# Patient Record
Sex: Female | Born: 1990
Health system: Southern US, Community
[De-identification: ages and names within clinical notes are randomized; demographics above are authoritative.]

## PROBLEM LIST (undated history)

## (undated) DIAGNOSIS — M419 Scoliosis, unspecified: Secondary | ICD-10-CM

## (undated) DIAGNOSIS — R87619 Unspecified abnormal cytological findings in specimens from cervix uteri: Secondary | ICD-10-CM

## (undated) DIAGNOSIS — A64 Unspecified sexually transmitted disease: Secondary | ICD-10-CM

## (undated) HISTORY — PX: COLPOSCOPY: SHX161

## (undated) HISTORY — PX: LEEP: SHX91

## (undated) HISTORY — DX: Unspecified sexually transmitted disease: A64

## (undated) HISTORY — DX: Unspecified abnormal cytological findings in specimens from cervix uteri: R87.619

---

## 2012-07-16 DIAGNOSIS — N9089 Other specified noninflammatory disorders of vulva and perineum: Secondary | ICD-10-CM | POA: Insufficient documentation

## 2012-08-11 DIAGNOSIS — N901 Moderate vulvar dysplasia: Secondary | ICD-10-CM | POA: Insufficient documentation

## 2013-08-18 ENCOUNTER — Emergency Department (HOSPITAL_COMMUNITY)
Admission: EM | Admit: 2013-08-18 | Discharge: 2013-08-18 | Disposition: A | Discharge status: Home / Self Care | Payer: BC Managed Care – PPO | Attending: Emergency Medicine | Admitting: Emergency Medicine

## 2013-08-18 ENCOUNTER — Encounter (HOSPITAL_COMMUNITY): Payer: Self-pay | Admitting: *Deleted

## 2013-08-18 DIAGNOSIS — R509 Fever, unspecified: Secondary | ICD-10-CM | POA: Insufficient documentation

## 2013-08-18 DIAGNOSIS — J069 Acute upper respiratory infection, unspecified: Secondary | ICD-10-CM | POA: Insufficient documentation

## 2013-08-18 DIAGNOSIS — R059 Cough, unspecified: Secondary | ICD-10-CM | POA: Insufficient documentation

## 2013-08-18 DIAGNOSIS — R52 Pain, unspecified: Secondary | ICD-10-CM | POA: Insufficient documentation

## 2013-08-18 DIAGNOSIS — R05 Cough: Secondary | ICD-10-CM | POA: Insufficient documentation

## 2013-08-18 DIAGNOSIS — J3489 Other specified disorders of nose and nasal sinuses: Secondary | ICD-10-CM | POA: Insufficient documentation

## 2013-08-18 DIAGNOSIS — J029 Acute pharyngitis, unspecified: Secondary | ICD-10-CM

## 2013-08-18 LAB — RAPID STREP SCREEN (MED CTR MEBANE ONLY): Streptococcus, Group A Screen (Direct): NEGATIVE

## 2013-08-18 MED ORDER — DEXAMETHASONE 6 MG PO TABS
12.0000 mg | ORAL_TABLET | Freq: Once | ORAL | Status: AC
  Administered 2013-08-18: 12 mg via ORAL
  Filled 2013-08-18: qty 2

## 2013-08-18 NOTE — ED Notes (Signed)
Pt c/o sore throat, fever, chills, generalized body aches, HA, productive cough.

## 2013-08-18 NOTE — ED Provider Notes (Signed)
CSN: [id number]     Arrival date & time 08/18/13  0350 History   First MD Initiated Contact with Patient 08/18/13 0357     Chief Complaint  Patient presents with  . URI   (Consider location/radiation/quality/duration/timing/severity/associated sxs/prior Treatment) Patient is a 22 y.o. female presenting with URI. The history is provided by the patient.  URI She comes in with a two-day history of sore throat, fever to 102, chills, nasal congestion, cough productive of yellow sputum. There is associated generalized bodyaches. She is complaining of pain that she rates at 9/10. She has used a variety of over-the-counter cough and cold medications with no relief. She states that her sister also is ill but got L. at the same time. There were no known sick contacts prior to her getting ill.  No past medical history on file. No past surgical history on file. No family history on file. History  Substance Use Topics  . Smoking status: Not on file  . Smokeless tobacco: Not on file  . Alcohol Use: Not on file   OB History   No data available     Review of Systems  All other systems reviewed and are negative.    Allergies  Review of patient's allergies indicates not on file.  Home Medications  No current outpatient prescriptions on file. BP 130/84  Pulse 89  Temp(Src) 98 F (36.7 C) (Oral)  Resp 18  SpO2 100% Physical Exam  Nursing note and vitals reviewed.  22 year old female, resting comfortably and in no acute distress. Vital signs are normal. Oxygen saturation is 100%, which is normal. Head is normocephalic and atraumatic. PERRLA, EOMI. Oropharynx is moderately erythematous without exudate. She's not having any difficulty handling secretions and phonation is normal. Turbinates are edematous bilaterally. No obvious nasal drainage is seen. Neck is nontender and supple without adenopathy or JVD. Back is nontender and there is no CVA tenderness. Lungs are clear without rales,  wheezes, or rhonchi. Chest is nontender. Heart has regular rate and rhythm without murmur. Abdomen is soft, flat, nontender without masses or hepatosplenomegaly and peristalsis is normoactive. Extremities have no cyanosis or edema, full range of motion is present. Skin is warm and dry without rash. Neurologic: Mental status is normal, cranial nerves are intact, there are no motor or sensory deficits.  ED Course  Procedures (including critical care time) Results for orders placed during the hospital encounter of 08/18/13  RAPID STREP SCREEN      Result Value Range   Streptococcus, Group A Screen (Direct) NEGATIVE  NEGATIVE    MDM   1. Upper respiratory infection   2. Pharyngitis     upper respiratory infection with pharyngitis. Strep screen will be obtained and she will be treated symptomatically if negative. She is given a dose of dexamethasone. Old records are reviewed and there are no relevant past visits.    Dione Booze, MD 08/18/13 [phone]

## 2013-08-20 LAB — CULTURE, GROUP A STREP

## 2013-12-19 ENCOUNTER — Emergency Department (HOSPITAL_COMMUNITY)
Admission: EM | Admit: 2013-12-19 | Discharge: 2013-12-19 | Disposition: A | Discharge status: Other Acute Inpatient Hospital | Payer: BC Managed Care – PPO

## 2013-12-19 ENCOUNTER — Encounter (HOSPITAL_COMMUNITY): Payer: Self-pay | Admitting: Emergency Medicine

## 2013-12-19 DIAGNOSIS — R197 Diarrhea, unspecified: Secondary | ICD-10-CM | POA: Insufficient documentation

## 2013-12-19 DIAGNOSIS — Z882 Allergy status to sulfonamides status: Secondary | ICD-10-CM | POA: Insufficient documentation

## 2013-12-19 DIAGNOSIS — Z888 Allergy status to other drugs, medicaments and biological substances status: Secondary | ICD-10-CM | POA: Insufficient documentation

## 2013-12-19 DIAGNOSIS — R6883 Chills (without fever): Secondary | ICD-10-CM | POA: Insufficient documentation

## 2013-12-19 DIAGNOSIS — Z3202 Encounter for pregnancy test, result negative: Secondary | ICD-10-CM | POA: Insufficient documentation

## 2013-12-19 DIAGNOSIS — R112 Nausea with vomiting, unspecified: Secondary | ICD-10-CM | POA: Insufficient documentation

## 2013-12-19 DIAGNOSIS — R109 Unspecified abdominal pain: Secondary | ICD-10-CM | POA: Insufficient documentation

## 2013-12-19 DIAGNOSIS — IMO0001 Reserved for inherently not codable concepts without codable children: Secondary | ICD-10-CM | POA: Insufficient documentation

## 2013-12-19 DIAGNOSIS — Z881 Allergy status to other antibiotic agents status: Secondary | ICD-10-CM | POA: Insufficient documentation

## 2013-12-19 LAB — COMPREHENSIVE METABOLIC PANEL
AST: 16 U/L (ref 0–37)
BUN: 9 mg/dL (ref 6–23)
CO2: 27 mEq/L (ref 19–32)
Calcium: 8.5 mg/dL (ref 8.4–10.5)
Chloride: 104 mEq/L (ref 96–112)
Creatinine, Ser: 0.8 mg/dL (ref 0.50–1.10)
GFR calc Af Amer: >90 mL/min (ref >90)
GFR calc non Af Amer: >90 mL/min (ref >90)
Total Bilirubin: 0.3 mg/dL (ref 0.3–1.2)

## 2013-12-19 LAB — URINALYSIS, ROUTINE W REFLEX MICROSCOPIC
Glucose, UA: NEGATIVE mg/dL
Ketones, ur: NEGATIVE mg/dL
Nitrite: NEGATIVE
Protein, ur: NEGATIVE mg/dL
Urobilinogen, UA: 1 mg/dL (ref 0.0–1.0)

## 2013-12-19 LAB — CBC WITH DIFFERENTIAL/PLATELET
Basophils Absolute: 0 10*3/uL (ref 0.0–0.1)
Eosinophils Relative: 2 % (ref 0–5)
HCT: 38.9 % (ref 36.0–46.0)
Hemoglobin: 13 g/dL (ref 12.0–15.0)
Lymphocytes Relative: 43 % (ref 12–46)
MCHC: 33.4 g/dL (ref 30.0–36.0)
MCV: 88.4 fL (ref 78.0–100.0)
Monocytes Absolute: 0.8 10*3/uL (ref 0.1–1.0)
Monocytes Relative: 16 % — ABNORMAL HIGH (ref 3–12)
RDW: 12.9 % (ref 11.5–15.5)
WBC: 5.3 10*3/uL (ref 4.0–10.5)

## 2013-12-19 LAB — URINE MICROSCOPIC-ADD ON

## 2013-12-19 LAB — POCT PREGNANCY, URINE: Preg Test, Ur: NEGATIVE

## 2013-12-19 LAB — LIPASE, BLOOD: Lipase: 19 U/L (ref 11–59)

## 2013-12-19 NOTE — ED Notes (Signed)
Pt. reports emesis , diarrhea , body aches and chills onset Saturday this week .

## 2013-12-20 ENCOUNTER — Emergency Department (HOSPITAL_COMMUNITY)
Admission: EM | Admit: 2013-12-20 | Discharge: 2013-12-20 | Disposition: A | Discharge status: Home / Self Care | Payer: BC Managed Care – PPO | Attending: Emergency Medicine | Admitting: Emergency Medicine

## 2013-12-20 DIAGNOSIS — R112 Nausea with vomiting, unspecified: Secondary | ICD-10-CM

## 2013-12-20 MED ORDER — LOPERAMIDE HCL 2 MG PO CAPS
4.0000 mg | ORAL_CAPSULE | Freq: Once | ORAL | Status: AC
  Administered 2013-12-20: 4 mg via ORAL
  Filled 2013-12-20: qty 2

## 2013-12-20 MED ORDER — SODIUM CHLORIDE 0.9 % IV SOLN
1000.0000 mL | INTRAVENOUS | Status: DC
  Administered 2013-12-20: 1000 mL via INTRAVENOUS

## 2013-12-20 MED ORDER — SODIUM CHLORIDE 0.9 % IV SOLN
1000.0000 mL | Freq: Once | INTRAVENOUS | Status: AC
  Administered 2013-12-20: 1000 mL via INTRAVENOUS

## 2013-12-20 MED ORDER — ONDANSETRON HCL 4 MG PO TABS: 4.0000 mg | ORAL_TABLET | Freq: Four times a day (QID) | ORAL | Status: DC | PRN

## 2013-12-20 MED ORDER — ONDANSETRON HCL 4 MG/2ML IJ SOLN
4.0000 mg | Freq: Once | INTRAMUSCULAR | Status: AC
  Administered 2013-12-20: 4 mg via INTRAVENOUS
  Filled 2013-12-20: qty 2

## 2013-12-20 NOTE — ED Notes (Signed)
No further nausea or vomiting or diarrhea.  Patient ambulated to the BR without difficulty

## 2013-12-20 NOTE — ED Notes (Signed)
Pt states Saturday she ate pizza, and started throwing up and having diarrhea. Pt states ever since then shes been having lots of n/v/d. States mid lower belly pain. Pt states last time she threw up was this morning. Pt states she had diarrhea five times today. Pt states shes been unable to eat much of anything. Denies problems with urinating.

## 2013-12-20 NOTE — ED Notes (Signed)
No further vomiting or diahrrea

## 2013-12-20 NOTE — ED Notes (Signed)
Patient states she vomited 2 times today and had diarrhea about 5 times

## 2013-12-20 NOTE — ED Provider Notes (Signed)
CSN: [id number]     Arrival date & time 12/19/13  2031 History   First MD Initiated Contact with Patient 12/20/13 0315     Chief Complaint  Patient presents with  . Emesis  . Diarrhea   (Consider location/radiation/quality/duration/timing/severity/associated sxs/prior Treatment) Patient is a 22 y.o. female presenting with vomiting and diarrhea. The history is provided by the patient.  Emesis Associated symptoms: diarrhea   Diarrhea Associated symptoms: vomiting   She has been having vomiting and diarrhea for the last 4 days. She is having 5 or more bowel movements a day and vomiting multiple times. She's not able to hold anything down. She denies fever but has had some chills. She has had some generalized bodyaches as well as some mild abdominal cramping. The abdominal cramping is improved following the bowel movement. Pain is mild she rates it 3/10. She denies any sick contacts. She relates her illness to eating some pizza but is unsure if anybody else who ate the pizza he became ill. She denies any blood or mucus in stool or emesis. She has not taken any medication to try and help her symptoms.  History reviewed. No pertinent past medical history. History reviewed. No pertinent past surgical history. Family History  Problem Relation Age of Onset  . Diabetes Mother   . Hypertension Mother   . Stroke Mother   . Hypertension Father   . Asthma Sister    History  Substance Use Topics  . Smoking status: Never Smoker   . Smokeless tobacco: Never Used  . Alcohol Use: Yes   OB History   Grav Para Term Preterm Abortions TAB SAB Ect Mult Living                 Review of Systems  Gastrointestinal: Positive for vomiting and diarrhea.  All other systems reviewed and are negative.    Allergies  Amoxicillin; Neomycin; and Sulfa antibiotics  Home Medications   Current Outpatient Rx  Name  Route  Sig  Dispense  Refill  . loperamide (IMODIUM) 1 MG/5ML solution   Oral   Take 1 mg  by mouth as needed for diarrhea or loose stools.         . Pseudoeph-Doxylamine-DM-APAP (DAYQUIL/NYQUIL COLD/FLU RELIEF PO)   Oral   Take 1 tablet by mouth 2 (two) times daily as needed (for cold symptoms).          BP 128/74  Pulse 88  Temp(Src) 99 F (37.2 C) (Oral)  Resp 16  Wt 161 lb (73.029 kg)  SpO2 99%  LMP 11/25/2013 Physical Exam  Nursing note and vitals reviewed.  22 year old female, resting comfortably and in no acute distress. Vital signs are significant for borderline hypertension with blood pressure 129/92. Oxygen saturation is 100%, which is normal. Head is normocephalic and atraumatic. PERRLA, EOMI. Oropharynx is clear. Neck is nontender and supple without adenopathy or JVD. Back is nontender and there is no CVA tenderness. Lungs are clear without rales, wheezes, or rhonchi. Chest is nontender. Heart has regular rate and rhythm without murmur. Abdomen is soft, flat, nontender without masses or hepatosplenomegaly and peristalsis is normoactive. Extremities have no cyanosis or edema, full range of motion is present. Skin is warm and dry without rash. Neurologic: Mental status is normal, cranial nerves are intact, there are no motor or sensory deficits.  ED Course  Procedures (including critical care time) Labs Review Results for orders placed during the hospital encounter of 12/20/13  CBC WITH DIFFERENTIAL  Result Value Range   WBC 5.3  4.0 - 10.5 K/uL   RBC 4.40  3.87 - 5.11 MIL/uL   Hemoglobin 13.0  12.0 - 15.0 g/dL   HCT 16.1  09.6 - 04.5 %   MCV 88.4  78.0 - 100.0 fL   MCH 29.5  26.0 - 34.0 pg   MCHC 33.4  30.0 - 36.0 g/dL   RDW 40.9  81.1 - 91.4 %   Platelets 229  150 - 400 K/uL   Neutrophils Relative % 38 (*) 43 - 77 %   Neutro Abs 2.0  1.7 - 7.7 K/uL   Lymphocytes Relative 43  12 - 46 %   Lymphs Abs 2.3  0.7 - 4.0 K/uL   Monocytes Relative 16 (*) 3 - 12 %   Monocytes Absolute 0.8  0.1 - 1.0 K/uL   Eosinophils Relative 2  0 - 5 %    Eosinophils Absolute 0.1  0.0 - 0.7 K/uL   Basophils Relative 1  0 - 1 %   Basophils Absolute 0.0  0.0 - 0.1 K/uL  COMPREHENSIVE METABOLIC PANEL      Result Value Range   Sodium 140  135 - 145 mEq/L   Potassium 3.4 (*) 3.5 - 5.1 mEq/L   Chloride 104  96 - 112 mEq/L   CO2 27  19 - 32 mEq/L   Glucose, Bld 80  70 - 99 mg/dL   BUN 9  6 - 23 mg/dL   Creatinine, Ser 7.82  0.50 - 1.10 mg/dL   Calcium 8.5  8.4 - 95.6 mg/dL   Total Protein 8.1  6.0 - 8.3 g/dL   Albumin 3.7  3.5 - 5.2 g/dL   AST 16  0 - 37 U/L   ALT 14  0 - 35 U/L   Alkaline Phosphatase 46  39 - 117 U/L   Total Bilirubin 0.3  0.3 - 1.2 mg/dL   GFR calc non Af Amer >90  >90 mL/min   GFR calc Af Amer >90  >90 mL/min  LIPASE, BLOOD      Result Value Range   Lipase 19  11 - 59 U/L  URINALYSIS, ROUTINE W REFLEX MICROSCOPIC      Result Value Range   Color, Urine YELLOW  YELLOW   APPearance CLOUDY (*) CLEAR   Specific Gravity, Urine 1.014  1.005 - 1.030   pH 6.0  5.0 - 8.0   Glucose, UA NEGATIVE  NEGATIVE mg/dL   Hgb urine dipstick MODERATE (*) NEGATIVE   Bilirubin Urine NEGATIVE  NEGATIVE   Ketones, ur NEGATIVE  NEGATIVE mg/dL   Protein, ur NEGATIVE  NEGATIVE mg/dL   Urobilinogen, UA 1.0  0.0 - 1.0 mg/dL   Nitrite NEGATIVE  NEGATIVE   Leukocytes, UA SMALL (*) NEGATIVE  URINE MICROSCOPIC-ADD ON      Result Value Range   Squamous Epithelial / LPF MANY (*) RARE   WBC, UA 3-6  <3 WBC/hpf   RBC / HPF 3-6  <3 RBC/hpf   Bacteria, UA MANY (*) RARE  POCT PREGNANCY, URINE      Result Value Range   Preg Test, Ur NEGATIVE  NEGATIVE   No results found.   MDM   1. Nausea vomiting and diarrhea    Vomiting and diarrhea which may be a form of food poisoning versus viral gastroenteritis. It spread of multiple bowel movements and vomiting and not begin to tolerate oral fluids, patient does not appear dehydrated. Mucous membranes are moist and laboratory  workup shows no evidence of dehydration. However, she will be given some IV  fluids and will be given IV ondansetron for nausea and oral loperamide for diarrhea.  She feels somewhat better after Proventil treatment. She is discharged with prescriptions for intervention done and is felt to continue  Dione Booze, MD 12/20/13 [phone]

## 2013-12-21 LAB — URINE CULTURE: Colony Count: >=100000

## 2015-10-11 ENCOUNTER — Emergency Department (HOSPITAL_COMMUNITY): Payer: BLUE CROSS/BLUE SHIELD

## 2015-10-11 ENCOUNTER — Encounter (HOSPITAL_COMMUNITY): Payer: Self-pay | Admitting: Emergency Medicine

## 2015-10-11 ENCOUNTER — Emergency Department (HOSPITAL_COMMUNITY)
Admission: EM | Admit: 2015-10-11 | Discharge: 2015-10-12 | Disposition: A | Discharge status: Home / Self Care | Payer: BLUE CROSS/BLUE SHIELD | Attending: Emergency Medicine | Admitting: Emergency Medicine

## 2015-10-11 DIAGNOSIS — Y998 Other external cause status: Secondary | ICD-10-CM | POA: Insufficient documentation

## 2015-10-11 DIAGNOSIS — Z8739 Personal history of other diseases of the musculoskeletal system and connective tissue: Secondary | ICD-10-CM | POA: Diagnosis not present

## 2015-10-11 DIAGNOSIS — W2203XA Walked into furniture, initial encounter: Secondary | ICD-10-CM | POA: Insufficient documentation

## 2015-10-11 DIAGNOSIS — Y9389 Activity, other specified: Secondary | ICD-10-CM | POA: Insufficient documentation

## 2015-10-11 DIAGNOSIS — S99922A Unspecified injury of left foot, initial encounter: Secondary | ICD-10-CM | POA: Diagnosis present

## 2015-10-11 DIAGNOSIS — S92912A Unspecified fracture of left toe(s), initial encounter for closed fracture: Secondary | ICD-10-CM

## 2015-10-11 DIAGNOSIS — Y9289 Other specified places as the place of occurrence of the external cause: Secondary | ICD-10-CM | POA: Insufficient documentation

## 2015-10-11 DIAGNOSIS — S92512A Displaced fracture of proximal phalanx of left lesser toe(s), initial encounter for closed fracture: Secondary | ICD-10-CM | POA: Diagnosis not present

## 2015-10-11 HISTORY — DX: Scoliosis, unspecified: M41.9

## 2015-10-12 MED ORDER — NAPROXEN 500 MG PO TABS
500.0000 mg | ORAL_TABLET | Freq: Once | ORAL | Status: AC
  Administered 2015-10-12: 500 mg via ORAL
  Filled 2015-10-12: qty 1

## 2015-10-12 MED ORDER — NAPROXEN 500 MG PO TABS: 500.0000 mg | ORAL_TABLET | Freq: Two times a day (BID) | ORAL | Status: DC

## 2015-10-12 NOTE — ED Provider Notes (Signed)
CSN: [id number]     Arrival date & time 10/11/15  2224 History   First MD Initiated Contact with Patient 10/11/15 2351     Chief Complaint  Patient presents with  . Foot Pain     (Consider location/radiation/quality/duration/timing/severity/associated sxs/prior Treatment) Patient is a 24 y.o. female presenting with foot injury. The history is provided by the patient. No language interpreter was used.  Foot Injury Location:  Toe Time since incident:  3 hours Injury: yes   Toe location:  L little toe Pain details:    Quality:  Aching and throbbing   Radiates to:  Does not radiate   Severity:  Moderate   Onset quality:  Sudden   Timing:  Constant   Progression:  Worsening Chronicity:  New Dislocation: no   Prior injury to area:  No Relieved by:  Nothing Worsened by:  Bearing weight Ineffective treatments:  Ice Associated symptoms: swelling   Associated symptoms: no decreased ROM and no numbness   Risk factors: no frequent fractures     Past Medical History  Diagnosis Date  . Scoliosis    History reviewed. No pertinent past surgical history. Family History  Problem Relation Age of Onset  . Diabetes Mother   . Hypertension Mother   . Stroke Mother   . Hypertension Father   . Asthma Sister    Social History  Substance Use Topics  . Smoking status: Never Smoker   . Smokeless tobacco: Never Used  . Alcohol Use: Yes   OB History    No data available     Review of Systems  Skin: Positive for color change (bruising).  All other systems reviewed and are negative.   Allergies  Amoxicillin; Neomycin; and Sulfa antibiotics  Home Medications   Prior to Admission medications   Medication Sig Start Date End Date Taking? Authorizing Provider  naproxen (NAPROSYN) 500 MG tablet Take 1 tablet (500 mg total) by mouth 2 (two) times daily. 10/12/15   Antony Madura, PA-C  ondansetron (ZOFRAN) 4 MG tablet Take 1 tablet (4 mg total) by mouth every 6 (six) hours as needed for  nausea or vomiting. Patient not taking: Reported on 10/11/2015 12/20/13   Dione Booze, MD   BP 128/81 mmHg  Pulse 94  Temp(Src) 97.8 F (36.6 C) (Oral)  Resp 18  SpO2 100%  LMP 09/17/2015   Physical Exam  Constitutional: She is oriented to person, place, and time. She appears well-developed and well-nourished. No distress.  HENT:  Head: Normocephalic and atraumatic.  Eyes: Conjunctivae and EOM are normal. No scleral icterus.  Neck: Normal range of motion.  Cardiovascular: Normal rate, regular rhythm and intact distal pulses.   DP and PT pulses 2+ in the left lower extremity. Capillary refill brisk in all digits of left foot.  Pulmonary/Chest: Effort normal. No respiratory distress.  Respirations even and unlabored  Musculoskeletal: Normal range of motion.       Left foot: There is tenderness, bony tenderness and swelling. There is normal range of motion, normal capillary refill and no deformity.       Feet:  Neurological: She is alert and oriented to person, place, and time. She exhibits normal muscle tone. Coordination normal.  Sensation to light touch intact in the RLE and foot. Patient able to wiggle all toes.  Skin: Skin is warm and dry. No rash noted. She is not diaphoretic. No erythema. No pallor.  Psychiatric: She has a normal mood and affect. Her behavior is normal.  Nursing note  and vitals reviewed.   ED Course  Procedures (including critical care time) Labs Review Labs Reviewed - No data to display  Imaging Review Dg Foot Complete Left  10/12/2015  CLINICAL DATA:  Injured pinky toe, hit foot on piece of furniture tonight. Pain and bruising. EXAM: LEFT FOOT - COMPLETE 3+ VIEW COMPARISON:  None. FINDINGS: Mildly displaced transverse fifth toe proximal phalanx fracture. No intra-articular extension. The remainder of the foot is intact. No additional fracture. Os peroneal incidentally noted. IMPRESSION: Mildly displaced fifth toe proximal phalanx fracture. Electronically  Signed   By: Rubye OaksMelanie  Ehinger M.D.   On: 10/12/2015 00:10   I have personally reviewed and evaluated these images and lab results as part of my medical decision-making.   EKG Interpretation None      MDM   Final diagnoses:  Closed fracture of proximal phalanx of toe of left foot    24 year old female percent study emergency department for pain to the base of her left fifth toe. X-ray shows a mildly displaced fifth toe proximal phalanx fracture. Patient neurovascularly intact. She has been given a cam walker for stability, especially since the patient is very mobile; currently on nursing clinicals. Crutches given for comfort as needed. Have discussed NSAIDs and RICE for treatment as well as orthopedic f/u to ensure proper healing. Return precautions given at discharge. Patient agreeable to plan with no unaddressed concerns. Patient discharged in good condition   Filed Vitals:   10/11/15 2238 10/11/15 2254 10/12/15 0054  BP: 127/67 117/67 128/81  Pulse: 102 92 94  Temp: 98 F (36.7 C) 97.8 F (36.6 C)   TempSrc: Oral Oral   Resp: 20 20 18   SpO2: 100% 100% 100%       Antony MaduraKelly Briggitte Boline, PA-C 10/12/15 0533  Loren Raceravid Yelverton, MD 10/13/15 [phone]

## 2015-10-12 NOTE — Discharge Instructions (Signed)
Toe Fracture °A toe fracture is a break in one of the toe bones (phalanges). °CAUSES °This condition may be caused by: °· Dropping a heavy object on your toe. °· Stubbing your toe. °· Overusing your toe or doing repetitive exercise. °· Twisting or stretching your toe out of place. °RISK FACTORS °This condition is more likely to develop in people who: °· Play contact sports. °· Have a bone disease. °· Have a low calcium level. °SYMPTOMS °The main symptoms of this condition are swelling and pain in the toe. The pain may get worse with standing or walking. Other symptoms include: °· Bruising. °· Stiffness. °· Numbness. °· A change in the way the toe looks. °· Broken bones that poke through the skin. °· Blood beneath the toenail. °DIAGNOSIS °This condition is diagnosed with a physical exam. You may also have X-rays. °TREATMENT  °Treatment for this condition depends on the type of fracture and its severity. Treatment may involve: °· Taping the broken toe to a toe that is next to it (buddy taping). This is the most common treatment for fractures in which the bone has not moved out of place (nondisplaced fracture). °· Wearing a shoe that has a wide, rigid sole to protect the toe and to limit its movement. °· Wearing a walking cast. °· Having a procedure to move the toe back into place. °· Surgery. This may be needed: °· If there are many pieces of broken bone that are out of place (displaced). °· If the toe joint breaks. °· If the bone breaks through the skin. °· Physical therapy. This is done to help regain movement and strength in the toe. °You may need follow-up X-rays to make sure that the bone is healing well and staying in position. °HOME CARE INSTRUCTIONS °If You Have a Cast: °· Do not stick anything inside the cast to scratch your skin. Doing that increases your risk of infection. °· Check the skin around the cast every day. Report any concerns to your health care provider. You may put lotion on dry skin around the  edges of the cast. Do not apply lotion to the skin underneath the cast. °· Do not put pressure on any part of the cast until it is fully hardened. This may take several hours. °· Keep the cast clean and dry. °Bathing °· Do not take baths, swim, or use a hot tub until your health care provider approves. Ask your health care provider if you can take showers. You may only be allowed to take sponge baths for bathing. °· If your health care provider approves bathing and showering, cover the cast or bandage (dressing) with a watertight plastic bag to protect it from water. Do not let the cast or dressing get wet. °Managing Pain, Stiffness, and Swelling °· If you do not have a cast, apply ice to the injured area, if directed. °¨ Put ice in a plastic bag. °¨ Place a towel between your skin and the bag. °¨ Leave the ice on for 20 minutes, 2-3 times per day. °· Move your toes often to avoid stiffness and to lessen swelling. °· Raise (elevate) the injured area above the level of your heart while you are sitting or lying down. °Driving °· Do not drive or operate heavy machinery while taking pain medicine. °· Do not drive while wearing a cast on a foot that you use for driving. °Activity °· Return to your normal activities as directed by your health care provider. Ask your health care   provider what activities are safe for you. °· Perform exercises daily as directed by your health care provider or physical therapist. °Safety °· Do not use the injured limb to support your body weight until your health care provider says that you can. Use crutches or other assistive devices as directed by your health care provider. °General Instructions °· If your toe was treated with buddy taping, follow your health care provider's instructions for changing the gauze and tape. Change it more often: °¨ The gauze and tape get wet. If this happens, dry the space between the toes. °¨ The gauze and tape are too tight and cause your toe to become pale  or numb. °· Wear a protective shoe as directed by your health care provider. If you were not given a protective shoe, wear sturdy, supportive shoes. Your shoes should not pinch your toes and should not fit tightly against your toes. °· Do not use any tobacco products, including cigarettes, chewing tobacco, or e-cigarettes. Tobacco can delay bone healing. If you need help quitting, ask your health care provider. °· Take medicines only as directed by your health care provider. °· Keep all follow-up visits as directed by your health care provider. This is important. °SEEK MEDICAL CARE IF: °· You have a fever. °· Your pain medicine is not helping. °· Your toe is cold. °· Your toe is numb. °· You still have pain after one week of rest and treatment. °· You still have pain after your health care provider has said that you can start walking again. °· You have pain, tingling, or numbness in your foot that is not going away. °SEEK IMMEDIATE MEDICAL CARE IF: °· You have severe pain. °· You have redness or inflammation in your toe that is getting worse. °· You have pain or numbness in your toe that is getting worse. °· Your toe turns blue. °  °This information is not intended to replace advice given to you by your health care provider. Make sure you discuss any questions you have with your health care provider. °  °Document Released: 12/05/2000 Document Revised: 08/29/2015 Document Reviewed: 10/04/2014 °Elsevier Interactive Patient Education ©2016 [employer] °RICE for Routine Care of Injuries °The routine care of many injuries includes rest, ice, compression, and elevation (RICE therapy). RICE therapy is often recommended for injuries to soft tissues, such as a muscle strain, ligament injuries, bruises, and overuse injuries. It can also be used for some bony injuries. Using RICE therapy can help to relieve pain, lessen swelling, and enable your body to heal. °Rest °Rest is required to allow your body to heal. This usually  involves reducing your normal activities and avoiding use of the injured part of your body. Generally, you can return to your normal activities when you are comfortable and have been given permission by your health care provider. °Ice °Icing your injury helps to keep the swelling down, and it lessens pain. Do not apply ice directly to your skin. °· Put ice in a plastic bag. °· Place a towel between your skin and the bag. °· Leave the ice on for 20 minutes, 2-3 times a day. °Do this for as long as you are directed by your health care provider. °Compression °Compression means putting pressure on the injured area. Compression helps to keep swelling down, gives support, and helps with discomfort. Compression may be done with an elastic bandage. If an elastic bandage has been applied, follow these general tips: °· Remove and reapply the bandage every 3-4 hours or as directed by   your health care provider. °· Make sure the bandage is not wrapped too tightly, because this can cut off circulation. If part of your body beyond the bandage becomes blue, numb, cold, swollen, or more painful, your bandage is most likely too tight. If this occurs, remove your bandage and reapply it more loosely. °· See your health care provider if the bandage seems to be making your problems worse rather than better. °Elevation °Elevation means keeping the injured area raised. This helps to lessen swelling and decrease pain. If possible, your injured area should be elevated at or above the level of your heart or the center of your chest. °WHEN SHOULD I SEEK MEDICAL CARE? °You should seek medical care if: °· Your pain and swelling continue. °· Your symptoms are getting worse rather than improving. °These symptoms may indicate that further evaluation or further X-rays are needed. Sometimes, X-rays may not show a small broken bone (fracture) until a number of days later. Make a follow-up appointment with your health care provider. °WHEN SHOULD I SEEK  IMMEDIATE MEDICAL CARE? °You should seek immediate medical care if: °· You have sudden severe pain at or below the area of your injury. °· You have redness or increased swelling around your injury. °· You have tingling or numbness at or below the area of your injury that does not improve after you remove the elastic bandage. °  °This information is not intended to replace advice given to you by your health care provider. Make sure you discuss any questions you have with your health care provider. °  °Document Released: 03/22/2001 Document Revised: 08/29/2015 Document Reviewed: 11/15/2014 °Elsevier Interactive Patient Education ©2016 [employer] ° ° °

## 2017-12-07 HISTORY — PX: CRYOTHERAPY: SHX1416

## 2018-06-04 ENCOUNTER — Encounter: Payer: Self-pay | Admitting: Certified Nurse Midwife

## 2018-06-04 ENCOUNTER — Other Ambulatory Visit (HOSPITAL_COMMUNITY)
Admission: RE | Admit: 2018-06-04 | Discharge: 2018-06-04 | Disposition: A | Discharge status: Home / Self Care | Payer: 59 | Source: Ambulatory Visit | Attending: Cardiology | Admitting: Cardiology

## 2018-06-04 ENCOUNTER — Ambulatory Visit: Payer: 59 | Admitting: Certified Nurse Midwife

## 2018-06-04 ENCOUNTER — Other Ambulatory Visit: Payer: Self-pay

## 2018-06-04 ENCOUNTER — Encounter (INDEPENDENT_AMBULATORY_CARE_PROVIDER_SITE_OTHER): Payer: Self-pay

## 2018-06-04 VITALS — BP 110/78 | HR 68 | Resp 16 | Ht 67.5 in | Wt 167.0 lb

## 2018-06-04 DIAGNOSIS — N912 Amenorrhea, unspecified: Secondary | ICD-10-CM

## 2018-06-04 DIAGNOSIS — Z124 Encounter for screening for malignant neoplasm of cervix: Secondary | ICD-10-CM | POA: Insufficient documentation

## 2018-06-04 DIAGNOSIS — Z113 Encounter for screening for infections with a predominantly sexual mode of transmission: Secondary | ICD-10-CM

## 2018-06-04 DIAGNOSIS — Z0000 Encounter for general adult medical examination without abnormal findings: Secondary | ICD-10-CM

## 2018-06-04 DIAGNOSIS — Z01419 Encounter for gynecological examination (general) (routine) without abnormal findings: Secondary | ICD-10-CM

## 2018-06-04 DIAGNOSIS — N901 Moderate vulvar dysplasia: Secondary | ICD-10-CM | POA: Diagnosis not present

## 2018-06-04 LAB — POCT URINE PREGNANCY: Preg Test, Ur: NEGATIVE

## 2018-06-04 NOTE — Progress Notes (Signed)
27 y.o. G0P0000 Single  African American Fe here to establish gyn care for annual exam. Periods regular and can be up to 35 days and last 4-7 days heavy to light, minimal cramping. Contraception none.  Has had VIN 2 in 2013 and 2018  cryotherapy for cervical dysplaisa, follow up pap normal. History of STD Chlamydia in 2011 treated. 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 No STD concerns but would like testing. No other health issues today.  Patient's last menstrual period was 04/21/2018.         Sexually active: Yes.    The current method of family planning is none.    Exercising: Yes.    cardio Smoker:  no  Health Maintenance: Pap:  01/07/18 neg (post cryo) History of Abnormal Pap: yes MMG:  none Self Breast exams: occ Colonoscopy:  none BMD:   none TDaP:  03-22-18 Shingles: no Pneumonia: no Hep C and HIV: not done Labs: poct upt-neg   reports that she has never smoked. She has never used smokeless tobacco. She reports that she drinks alcohol. She reports that she does not use drugs.  Past Medical History:  Diagnosis Date  . Abnormal Pap smear of cervix   . Scoliosis   . STD (sexually transmitted disease)    chlamydia 2011    Past Surgical History:  Procedure Laterality Date  . COLPOSCOPY    . CRYOTHERAPY  12/07/2017   for abnormal pap smear  . LEEP      No current outpatient medications on file.   No current facility-administered medications for this visit.     Family History  Problem Relation Age of Onset  . Diabetes Mother   . Hypertension Mother   . Stroke Mother   . Diabetes Sister   . Breast cancer Maternal Aunt   . Diabetes Maternal Grandmother   . Hypertension Maternal Grandmother   . Stroke Maternal Grandmother   . Heart attack Maternal Grandmother   . Prostate cancer Maternal Grandfather   . Hypertension Maternal Grandfather   . Breast cancer Paternal Grandmother   . Hypertension Paternal Grandmother   . Stroke Paternal Grandmother   . Heart  attack Paternal Grandmother   . Heart attack Paternal Grandfather   . Lung cancer Maternal Aunt     ROS:  Pertinent items are noted in HPI.  Otherwise, a comprehensive ROS was negative.  Exam:   BP 110/78   Pulse 68   Resp 16   Ht 5' 7.5" (1.715 m)   Wt 167 lb (75.8 kg)   LMP 04/21/2018   BMI 25.77 kg/m  Height: 5' 7.5" (171.5 cm) Ht Readings from Last 3 Encounters:  06/04/18 5' 7.5" (1.715 m)    General appearance: alert, cooperative and appears stated age Head: Normocephalic, without obvious abnormality, atraumatic Neck: no adenopathy, supple, symmetrical, trachea midline and thyroid slightly enlarged, no nodules palpated  Lungs: clear to auscultation bilaterally Breasts: normal appearance, no masses or tenderness, No nipple retraction or dimpling, No nipple discharge or bleeding, No axillary or supraclavicular adenopathy Heart: regular rate and rhythm Abdomen: soft, non-tender; no masses,  no organomegaly Extremities: extremities normal, atraumatic, no cyanosis or edema Skin: Skin color, texture, turgor normal. No rashes or lesions Lymph nodes: Cervical, supraclavicular, and axillary nodes normal. No abnormal inguinal nodes palpated Neurologic: Grossly normal   Pelvic: External genitalia:  no lesions              Urethra:  normal appearing urethra with no  masses, tenderness or lesions              Bartholin's and Skene's: normal                 Vagina: normal appearing vagina with normal color and discharge, no lesions              Cervix: no bleeding following Pap, no cervical motion tenderness and no lesions              Pap taken: Yes.   Bimanual Exam:  Uterus:  normal size, contour, position, consistency, mobility, non-tender and anteverted              Adnexa: normal adnexa and no mass, fullness, tenderness               Rectovaginal: Confirms               Anus:  normal sphincter tone, no lesions  Chaperone present: yes  A:  Well Woman with normal  exam  Contraception  None desired  History of Cervical dysplasia with Cryo  History of VIN 2  Screening labs  Amenorrhea with negative UPT  P:   Reviewed health and wellness pertinent to exam  Stressed condom use for STD protection  Stressed annual exam to make sure no changes with skin and cervix.  GC/Chlamydia, Affirm, STD panel, Hep C  Discussed if no period in 3 months needs to advise. Discussed Thyroid, Pituitary and hormonal change can interfere with menses pattern.  Lab: Thyroid panel with TSH, Prolactin FSH  Pap smear: yes   counseled on breast self exam, STD prevention, HIV risk factors and prevention, feminine hygiene, adequate intake of calcium and vitamin D, diet and exercise  return annually or prn  An After Visit Summary was printed and given to the patient.

## 2018-06-04 NOTE — Patient Instructions (Signed)
General topics  Next pap or exam is  due in 1 year Take a Women's multivitamin Take 1200 mg. of calcium daily - prefer dietary If any concerns in interim to call back  Breast Self-Awareness Practicing breast self-awareness may pick up problems early, prevent significant medical complications, and possibly save your life. By practicing breast self-awareness, you can become familiar with how your breasts look and feel and if your breasts are changing. This allows you to notice changes early. It can also offer you some reassurance that your breast health is good. One way to learn what is normal for your breasts and whether your breasts are changing is to do a breast self-exam. If you find a lump or something that was not present in the past, it is best to contact your caregiver right away. Other findings that should be evaluated by your caregiver include nipple discharge, especially if it is bloody; skin changes or reddening; areas where the skin seems to be pulled in (retracted); or new lumps and bumps. Breast pain is seldom associated with cancer (malignancy), but should also be evaluated by a caregiver. BREAST SELF-EXAM The best time to examine your breasts is 5 7 days after your menstrual period is over.  ExitCare Patient Information 2013 [employer].   Exercise to Stay Healthy Exercise helps you become and stay healthy. EXERCISE IDEAS AND TIPS Choose exercises that:  You enjoy.  Fit into your day. You do not need to exercise really hard to be healthy. You can do exercises at a slow or medium level and stay healthy. You can:  Stretch before and after working out.  Try yoga, Pilates, or tai chi.  Lift weights.  Walk fast, swim, jog, run, climb stairs, bicycle, dance, or rollerskate.  Take aerobic classes. Exercises that burn about 150 calories:  Running 1  miles in 15 minutes.  Playing volleyball for 45 to 60 minutes.  Washing and waxing a car for 45 to 60  minutes.  Playing touch football for 45 minutes.  Walking 1  miles in 35 minutes.  Pushing a stroller 1  miles in 30 minutes.  Playing basketball for 30 minutes.  Raking leaves for 30 minutes.  Bicycling 5 miles in 30 minutes.  Walking 2 miles in 30 minutes.  Dancing for 30 minutes.  Shoveling snow for 15 minutes.  Swimming laps for 20 minutes.  Walking up stairs for 15 minutes.  Bicycling 4 miles in 15 minutes.  Gardening for 30 to 45 minutes.  Jumping rope for 15 minutes.  Washing windows or floors for 45 to 60 minutes. Document Released: 01/10/2011 Document Revised: 03/01/2012 Document Reviewed: 01/10/2011 ExitCare Patient Information 2013 [employer].   Other topics ( that may be useful information):    Sexually Transmitted Disease Sexually transmitted disease (STD) refers to any infection that is passed from person to person during sexual activity. This may happen by way of saliva, semen, blood, vaginal mucus, or urine. Common STDs include:  Gonorrhea.  Chlamydia.  Syphilis.  HIV/AIDS.  Genital herpes.  Hepatitis B and C.  Trichomonas.  Human papillomavirus (HPV).  Pubic lice. CAUSES  An STD may be spread by bacteria, virus, or parasite. A person can get an STD by:  Sexual intercourse with an infected person.  Sharing sex toys with an infected person.  Sharing needles with an infected person.  Having intimate contact with the genitals, mouth, or rectal areas of an infected person. SYMPTOMS  Some people may not have any symptoms, but   they can still pass the infection to others. Different STDs have different symptoms. Symptoms include:  Painful or bloody urination.  Pain in the pelvis, abdomen, vagina, anus, throat, or eyes.  Skin rash, itching, irritation, growths, or sores (lesions). These usually occur in the genital or anal area.  Abnormal vaginal discharge.  Penile discharge in men.  Soft, flesh-colored skin growths in the  genital or anal area.  Fever.  Pain or bleeding during sexual intercourse.  Swollen glands in the groin area.  Yellow skin and eyes (jaundice). This is seen with hepatitis. DIAGNOSIS  To make a diagnosis, your caregiver may:  Take a medical history.  Perform a physical exam.  Take a specimen (culture) to be examined.  Examine a sample of discharge under a microscope.  Perform blood test TREATMENT   Chlamydia, gonorrhea, trichomonas, and syphilis can be cured with antibiotic medicine.  Genital herpes, hepatitis, and HIV can be treated, but not cured, with prescribed medicines. The medicines will lessen the symptoms.  Genital warts from HPV can be treated with medicine or by freezing, burning (electrocautery), or surgery. Warts may come back.  HPV is a virus and cannot be cured with medicine or surgery.However, abnormal areas may be followed very closely by your caregiver and may be removed from the cervix, vagina, or vulva through office procedures or surgery. If your diagnosis is confirmed, your recent sexual partners need treatment. This is true even if they are symptom-free or have a negative culture or evaluation. They should not have sex until their caregiver says it is okay. HOME CARE INSTRUCTIONS  All sexual partners should be informed, tested, and treated for all STDs.  Take your antibiotics as directed. Finish them even if you start to feel better.  Only take over-the-counter or prescription medicines for pain, discomfort, or fever as directed by your caregiver.  Rest.  Eat a balanced diet and drink enough fluids to keep your urine clear or pale yellow.  Do not have sex until treatment is completed and you have followed up with your caregiver. STDs should be checked after treatment.  Keep all follow-up appointments, Pap tests, and blood tests as directed by your caregiver.  Only use latex condoms and water-soluble lubricants during sexual activity. Do not use  petroleum jelly or oils.  Avoid alcohol and illegal drugs.  Get vaccinated for HPV and hepatitis. If you have not received these vaccines in the past, talk to your caregiver about whether one or both might be right for you.  Avoid risky sex practices that can break the skin. The only way to avoid getting an STD is to avoid all sexual activity.Latex condoms and dental dams (for oral sex) will help lessen the risk of getting an STD, but will not completely eliminate the risk. SEEK MEDICAL CARE IF:   You have a fever.  You have any new or worsening symptoms. Document Released: 02/28/2003 Document Revised: 03/01/2012 Document Reviewed: 03/07/2011 [facility] Patient Information 2013 [employer].    Domestic Abuse You are being battered or abused if someone close to you hits, pushes, or physically hurts you in any way. You also are being abused if you are forced into activities. You are being sexually abused if you are forced to have sexual contact of any kind. You are being emotionally abused if you are made to feel worthless or if you are constantly threatened. It is important to remember that help is available. No one has the right to abuse you. PREVENTION OF FURTHER  ABUSE  Learn the warning signs of danger. This varies with situations but may include: the use of alcohol, threats, isolation from friends and family, or forced sexual contact. Leave if you feel that violence is going to occur.  If you are attacked or beaten, report it to the police so the abuse is documented. You do not have to press charges. The police can protect you while you or the attackers are leaving. Get the officer's name and badge number and a copy of the report.  Find someone you can trust and tell them what is happening to you: your caregiver, a nurse, clergy member, close friend or family member. Feeling ashamed is natural, but remember that you have done nothing wrong. No one deserves abuse. Document Released:  12/05/2000 Document Revised: 03/01/2012 Document Reviewed: 02/13/2011 ExitCare Patient Information 2013 [employer].    How Much is Too Much Alcohol? Drinking too much alcohol can cause injury, accidents, and health problems. These types of problems can include:   Car crashes.  Falls.  Family fighting (domestic violence).  Drowning.  Fights.  Injuries.  Burns.  Damage to certain organs.  Having a baby with birth defects. ONE DRINK CAN BE TOO MUCH WHEN YOU ARE:  Working.  Pregnant or breastfeeding.  Taking medicines. Ask your doctor.  Driving or planning to drive. If you or someone you know has a drinking problem, get help from a doctor.  Document Released: 10/04/2009 Document Revised: 03/01/2012 Document Reviewed: 10/04/2009 ExitCare Patient Information 2013 [employer].   Smoking Hazards Smoking cigarettes is extremely bad for your health. Tobacco smoke has over 200 known poisons in it. There are over 60 chemicals in tobacco smoke that cause cancer. Some of the chemicals found in cigarette smoke include:   Cyanide.  Benzene.  Formaldehyde.  Methanol (wood alcohol).  Acetylene (fuel used in welding torches).  Ammonia. Cigarette smoke also contains the poisonous gases nitrogen oxide and carbon monoxide.  Cigarette smokers have an increased risk of many serious medical problems and Smoking causes approximately:  90% of all lung cancer deaths in men.  80% of all lung cancer deaths in women.  90% of deaths from chronic obstructive lung disease. Compared with nonsmokers, smoking increases the risk of:  Coronary heart disease by 2 to 4 times.  Stroke by 2 to 4 times.  Men developing lung cancer by 23 times.  Women developing lung cancer by 13 times.  Dying from chronic obstructive lung diseases by 12 times.  . Smoking is the most preventable cause of death and disease in our society.  WHY IS SMOKING ADDICTIVE?  Nicotine is the chemical  agent in tobacco that is capable of causing addiction or dependence.  When you smoke and inhale, nicotine is absorbed rapidly into the bloodstream through your lungs. Nicotine absorbed through the lungs is capable of creating a powerful addiction. Both inhaled and non-inhaled nicotine may be addictive.  Addiction studies of cigarettes and spit tobacco show that addiction to nicotine occurs mainly during the teen years, when young people begin using tobacco products. WHAT ARE THE BENEFITS OF QUITTING?  There are many health benefits to quitting smoking.   Likelihood of developing cancer and heart disease decreases. Health improvements are seen almost immediately.  Blood pressure, pulse rate, and breathing patterns start returning to normal soon after quitting. QUITTING SMOKING   American Lung Association - 1-800-LUNGUSA  American Cancer Society - 1-800-ACS-2345 Document Released: 01/15/2005 Document Revised: 03/01/2012 Document Reviewed: 09/19/2009 ExitCare Patient Information 2013 ExitCare,   LLC.   Stress Management Stress is a state of physical or mental tension that often results from changes in your life or normal routine. Some common causes of stress are:  Death of a loved one.  Injuries or severe illnesses.  Getting fired or changing jobs.  Moving into a new home. Other causes may be:  Sexual problems.  Business or financial losses.  Taking on a large debt.  Regular conflict with someone at home or at work.  Constant tiredness from lack of sleep. It is not just bad things that are stressful. It may be stressful to:  Win the lottery.  Get married.  Buy a new car. The amount of stress that can be easily tolerated varies from person to person. Changes generally cause stress, regardless of the types of change. Too much stress can affect your health. It may lead to physical or emotional problems. Too little stress (boredom) may also become stressful. SUGGESTIONS TO  REDUCE STRESS:  Talk things over with your family and friends. It often is helpful to share your concerns and worries. If you feel your problem is serious, you may want to get help from a professional counselor.  Consider your problems one at a time instead of lumping them all together. Trying to take care of everything at once may seem impossible. List all the things you need to do and then start with the most important one. Set a goal to accomplish 2 or 3 things each day. If you expect to do too many in a single day you will naturally fail, causing you to feel even more stressed.  Do not use alcohol or drugs to relieve stress. Although you may feel better for a short time, they do not remove the problems that caused the stress. They can also be habit forming.  Exercise regularly - at least 3 times per week. Physical exercise can help to relieve that "uptight" feeling and will relax you.  The shortest distance between despair and hope is often a good night's sleep.  Go to bed and get up on time allowing yourself time for appointments without being rushed.  Take a short "time-out" period from any stressful situation that occurs during the day. Close your eyes and take some deep breaths. Starting with the muscles in your face, tense them, hold it for a few seconds, then relax. Repeat this with the muscles in your neck, shoulders, hand, stomach, back and legs.  Take good care of yourself. Eat a balanced diet and get plenty of rest.  Schedule time for having fun. Take a break from your daily routine to relax. HOME CARE INSTRUCTIONS   Call if you feel overwhelmed by your problems and feel you can no longer manage them on your own.  Return immediately if you feel like hurting yourself or someone else. Document Released: 06/03/2001 Document Revised: 03/01/2012 Document Reviewed: 01/24/2008 ExitCare Patient Information 2013 ExitCare[employer].  

## 2018-06-05 LAB — GC/CHLAMYDIA PROBE AMP
Chlamydia trachomatis, NAA: NEGATIVE
NEISSERIA GONORRHOEAE BY PCR: NEGATIVE

## 2018-06-05 LAB — THYROID PANEL WITH TSH
FREE THYROXINE INDEX: 1.7 (ref 1.2–4.9)
T3 UPTAKE RATIO: 29 % (ref 24–39)
T4, Total: 5.7 ug/dL (ref 4.5–12.0)
TSH: 0.684 u[IU]/mL (ref 0.450–4.500)

## 2018-06-05 LAB — VAGINITIS/VAGINOSIS, DNA PROBE
CANDIDA SPECIES: NEGATIVE
GARDNERELLA VAGINALIS: POSITIVE — AB
TRICHOMONAS VAG: NEGATIVE

## 2018-06-05 LAB — HEP, RPR, HIV PANEL
HIV Screen 4th Generation wRfx: NONREACTIVE
Hepatitis B Surface Ag: NEGATIVE
RPR Ser Ql: NONREACTIVE

## 2018-06-05 LAB — HEPATITIS C ANTIBODY: Hep C Virus Ab: <0.1 s/co ratio (ref 0.0–0.9)

## 2018-06-05 LAB — PROLACTIN: Prolactin: 10.8 ng/mL (ref 4.8–23.3)

## 2018-06-05 LAB — FOLLICLE STIMULATING HORMONE: FSH: 6.3 m[IU]/mL

## 2018-06-09 ENCOUNTER — Telehealth: Payer: Self-pay | Admitting: *Deleted

## 2018-06-09 MED ORDER — METRONIDAZOLE 500 MG PO TABS: 500.0000 mg | ORAL_TABLET | Freq: Two times a day (BID) | ORAL | 0 refills | Status: DC

## 2018-06-09 NOTE — Telephone Encounter (Signed)
-----   Message from Patton SallesBrook E Amundson C Silva, MD sent at 06/09/2018 12:28 PM EDT ----- Please contact patient with results of testing.   This is Dr. Edward JollySilva reviewing lab results for Jamie Mcguire while she is out of the office.   The patient has bacterial vaginosis. She may treat with Flagyl 500 mg po bid for 7 days or Metrogel pv at hs for 5 nights.  Please send Rx to pharmacy of choice. ETOH precautions.   Her testing for infectious disease is all negative for HIV, syphilis, hepatitis B and C, trichomonas, gonorrhea, and chlamydia.   She tested negative for yeast.   Her FSH, prolactin, and thyroid testing are all normal.

## 2018-06-09 NOTE — Telephone Encounter (Signed)
Notes recorded by Leda MinHamm, Nikita Humble N, RN on 06/09/2018 at 12:53 PM EDT Left message to call Noreene LarssonJill at [phone].

## 2018-06-09 NOTE — Telephone Encounter (Signed)
Spoke with patient. Patient verbalizes understanding. Rx for Flagyl 500 mg po BID x 7 days #14 0RF sent to pharmacy on file. Avoid alcohol during treatment and 24 hours after completing medication. Don't mix with alcohol if mixed can cause severe nausea, vomiting and abdominal cramping. Patient verbalizes understanding. Encounter closed.

## 2018-06-09 NOTE — Telephone Encounter (Signed)
Patient returning call to [place]. Stated that it is OK to leave voicemail with the results.

## 2018-06-11 LAB — CYTOLOGY - PAP
Diagnosis: UNDETERMINED — AB
HPV (WINDOPATH): NOT DETECTED

## 2018-07-08 DIAGNOSIS — H5213 Myopia, bilateral: Secondary | ICD-10-CM | POA: Diagnosis not present

## 2018-10-05 ENCOUNTER — Other Ambulatory Visit: Payer: Self-pay

## 2018-10-05 ENCOUNTER — Encounter: Payer: Self-pay | Admitting: Certified Nurse Midwife

## 2018-10-05 ENCOUNTER — Ambulatory Visit: Payer: 59 | Admitting: Certified Nurse Midwife

## 2018-10-05 VITALS — BP 110/62 | HR 64 | Temp 99.0°F | Resp 16 | Ht 67.75 in | Wt 161.0 lb

## 2018-10-05 DIAGNOSIS — R300 Dysuria: Secondary | ICD-10-CM

## 2018-10-05 DIAGNOSIS — N3000 Acute cystitis without hematuria: Secondary | ICD-10-CM | POA: Diagnosis not present

## 2018-10-05 DIAGNOSIS — Z113 Encounter for screening for infections with a predominantly sexual mode of transmission: Secondary | ICD-10-CM

## 2018-10-05 LAB — POCT URINALYSIS DIPSTICK
BILIRUBIN UA: NEGATIVE
GLUCOSE UA: NEGATIVE
Ketones, UA: NEGATIVE
Nitrite, UA: NEGATIVE
Protein, UA: NEGATIVE
UROBILINOGEN UA: NEGATIVE U/dL — AB
pH, UA: 5 (ref 5.0–8.0)

## 2018-10-05 NOTE — Patient Instructions (Signed)

## 2018-10-05 NOTE — Progress Notes (Signed)
27 y.o. Single African American female G0P0000 here with pain with  urination complaint of UTI, with onset  on last 24 hours.. Patient complaining of pain with urination since last sexual activity.. Patient also took Plan B prior to period. Partner did not use withdrawal method. Patient denies fever, chills, nausea or back pain. No new personal products. Patient feels maybe related to sexual activity. Denies any vaginal symptoms except vaginal burning when urine touches skin only.  Contraception is withdrawal. Requests STD screening vaginal only to make sure no other problems. No other health issues today.  Review of Systems  Constitutional: Negative.   HENT: Negative.   Eyes: Negative.   Respiratory: Negative.   Cardiovascular: Negative.   Gastrointestinal: Negative.   Genitourinary:       Burning with urination  Musculoskeletal: Negative.   Skin: Negative.   Neurological: Negative.   Endo/Heme/Allergies: Negative.   Psychiatric/Behavioral: Negative.     O: Healthy female WDWN Affect: Normal, orientation x 3 Skin : warm and dry CVAT: negative bilateral Abdomen: positive for suprapubic tenderness  Pelvic exam: External genital area: normal, no lesions, tiny scratch under clitoral hood noted Bladder,Urethra tender, Urethral meatus: tender, red Vagina: normal vaginal discharge, normal appearance  Affirm taken and GC/Chlamydia Cervix: normal, non tender Uterus:normal,non tender Adnexa: normal non tender, no fullness or masses   A: UTI Normal pelvic exam Contraception Withdrawal/Plan B R/O STD infection  P: Reviewed findings of UTI and need for treatment. [id number] see order with instructions [id number] micro, culture,  Reviewed warning signs and symptoms of UTI and need to advise if occurring. Encouraged to limit soda, tea, and coffee and be sure to increase water intake. Stressed condom use for contraception and STD protection. Plan B is not contraception, consider OCP use.   Lab: Affirm, Gc,Chlamydia Questions addressed.  Rv prn

## 2018-10-06 LAB — VAGINITIS/VAGINOSIS, DNA PROBE
CANDIDA SPECIES: NEGATIVE
GARDNERELLA VAGINALIS: NEGATIVE
Trichomonas vaginosis: NEGATIVE

## 2018-10-06 LAB — URINALYSIS, MICROSCOPIC ONLY
CASTS: NONE SEEN /LPF
RBC, UA: >30 /hpf — AB (ref 0–2)

## 2018-10-07 LAB — GC/CHLAMYDIA PROBE AMP
CHLAMYDIA, DNA PROBE: NEGATIVE
NEISSERIA GONORRHOEAE BY PCR: NEGATIVE

## 2018-10-07 LAB — URINE CULTURE: ORGANISM ID, BACTERIA: NO GROWTH

## 2019-01-17 ENCOUNTER — Other Ambulatory Visit: Payer: Self-pay

## 2019-01-17 ENCOUNTER — Encounter (HOSPITAL_BASED_OUTPATIENT_CLINIC_OR_DEPARTMENT_OTHER): Payer: Self-pay | Admitting: *Deleted

## 2019-01-17 ENCOUNTER — Emergency Department (HOSPITAL_BASED_OUTPATIENT_CLINIC_OR_DEPARTMENT_OTHER): Payer: 59

## 2019-01-17 ENCOUNTER — Emergency Department (HOSPITAL_BASED_OUTPATIENT_CLINIC_OR_DEPARTMENT_OTHER)
Admission: EM | Admit: 2019-01-17 | Discharge: 2019-01-17 | Disposition: A | Discharge status: Home / Self Care | Payer: 59 | Attending: Emergency Medicine | Admitting: Emergency Medicine

## 2019-01-17 DIAGNOSIS — M419 Scoliosis, unspecified: Secondary | ICD-10-CM | POA: Diagnosis not present

## 2019-01-17 DIAGNOSIS — M546 Pain in thoracic spine: Secondary | ICD-10-CM | POA: Insufficient documentation

## 2019-01-17 DIAGNOSIS — M545 Low back pain: Secondary | ICD-10-CM | POA: Diagnosis not present

## 2019-01-17 DIAGNOSIS — S299XXA Unspecified injury of thorax, initial encounter: Secondary | ICD-10-CM | POA: Diagnosis not present

## 2019-01-17 DIAGNOSIS — S3992XA Unspecified injury of lower back, initial encounter: Secondary | ICD-10-CM | POA: Diagnosis not present

## 2019-01-17 DIAGNOSIS — M549 Dorsalgia, unspecified: Secondary | ICD-10-CM | POA: Diagnosis not present

## 2019-01-17 MED ORDER — IBUPROFEN 600 MG PO TABS: 600.0000 mg | ORAL_TABLET | Freq: Four times a day (QID) | ORAL | 0 refills | Status: DC | PRN

## 2019-01-17 MED ORDER — METHOCARBAMOL 750 MG PO TABS: 750.0000 mg | ORAL_TABLET | Freq: Two times a day (BID) | ORAL | 0 refills | Status: DC

## 2019-01-17 MED ORDER — ACETAMINOPHEN 500 MG PO TABS: 500.0000 mg | ORAL_TABLET | Freq: Four times a day (QID) | ORAL | 0 refills | Status: DC | PRN
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
## 2019-01-17 NOTE — Discharge Instructions (Signed)
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
## 2019-01-25 DIAGNOSIS — M549 Dorsalgia, unspecified: Secondary | ICD-10-CM | POA: Diagnosis not present

## 2019-02-08 DIAGNOSIS — M549 Dorsalgia, unspecified: Secondary | ICD-10-CM | POA: Diagnosis not present

## 2019-03-01 DIAGNOSIS — M5441 Lumbago with sciatica, right side: Secondary | ICD-10-CM | POA: Diagnosis not present

## 2019-03-01 DIAGNOSIS — G8929 Other chronic pain: Secondary | ICD-10-CM | POA: Diagnosis not present

## 2019-03-01 DIAGNOSIS — M549 Dorsalgia, unspecified: Secondary | ICD-10-CM | POA: Diagnosis not present

## 2019-03-07 DIAGNOSIS — M25571 Pain in right ankle and joints of right foot: Secondary | ICD-10-CM | POA: Diagnosis not present

## 2019-03-07 DIAGNOSIS — M25572 Pain in left ankle and joints of left foot: Secondary | ICD-10-CM | POA: Diagnosis not present

## 2019-04-04 ENCOUNTER — Telehealth: Payer: Self-pay | Admitting: Certified Nurse Midwife

## 2019-04-04 NOTE — Telephone Encounter (Signed)
Patient is currently experiencing discharge and would like to "rule out an infection." Patient scheduled appointment for 04/06/2019 at 10am. Patient declined earlier appointment. Routing to triage.

## 2019-04-06 ENCOUNTER — Other Ambulatory Visit: Payer: Self-pay

## 2019-04-06 ENCOUNTER — Ambulatory Visit: Payer: 59 | Admitting: Obstetrics and Gynecology

## 2019-04-06 ENCOUNTER — Encounter: Payer: Self-pay | Admitting: Obstetrics and Gynecology

## 2019-04-06 VITALS — BP 128/60 | HR 98 | Temp 98.3°F | Ht 67.25 in | Wt 176.0 lb

## 2019-04-06 DIAGNOSIS — N760 Acute vaginitis: Secondary | ICD-10-CM | POA: Diagnosis not present

## 2019-04-06 DIAGNOSIS — N912 Amenorrhea, unspecified: Secondary | ICD-10-CM | POA: Diagnosis not present

## 2019-04-06 DIAGNOSIS — Z113 Encounter for screening for infections with a predominantly sexual mode of transmission: Secondary | ICD-10-CM | POA: Diagnosis not present

## 2019-04-06 LAB — POCT URINE PREGNANCY: Preg Test, Ur: NEGATIVE

## 2019-04-06 NOTE — Progress Notes (Signed)
GYNECOLOGY  VISIT   HPI: 28 y.o.   Single  African American  female   [id number] with Patient's last menstrual period was 03/05/2019 (exact date).   here for  Vaginal discharge    Clear yellow discharge 2 days ago. No itching, burning, or odor.   Wants STD testing.  Does not use condoms.   No pelvic pain or discomfort.   No recent abx or recent steroids.   Hx BV in January, treated with Flagyl.  States she has irregular menses.  Would be ok with pregnancy.  UPT negative today.   GYNECOLOGIC HISTORY: Patient's last menstrual period was 03/05/2019 (exact date). Contraception:  none Menopausal hormone therapy:  none Last mammogram:  none Last pap smear:   06/04/18 ASCUS. HR HPV:neg         OB History    Gravida  0   Para  0   Term  0   Preterm  0   AB  0   Living  0     SAB  0   TAB  0   Ectopic  0   Multiple  0   Live Births  0              Patient Active Problem List   Diagnosis Date Noted  . Vulvar intraepithelial neoplasia (VIN) grade 2 08/11/2012  . Vulval lesion 07/16/2012    Past Medical History:  Diagnosis Date  . Abnormal Pap smear of cervix   . Scoliosis   . STD (sexually transmitted disease)    chlamydia 2011    Past Surgical History:  Procedure Laterality Date  . COLPOSCOPY    . CRYOTHERAPY  12/07/2017   for abnormal pap smear  . LEEP      Current Outpatient Medications  Medication Sig Dispense Refill  . meloxicam (MOBIC) 7.5 MG tablet Take 1 tablet by mouth daily.     No current facility-administered medications for this visit.      ALLERGIES: Amoxicillin; Tramadol; Neomycin; and Sulfa antibiotics  Family History  Problem Relation Age of Onset  . Diabetes Mother   . Hypertension Mother   . Stroke Mother   . Diabetes Sister   . Breast cancer Maternal Aunt   . Diabetes Maternal Grandmother   . Hypertension Maternal Grandmother   . Stroke Maternal Grandmother   . Heart attack Maternal Grandmother   . Prostate  cancer Maternal Grandfather   . Hypertension Maternal Grandfather   . Breast cancer Paternal Grandmother   . Hypertension Paternal Grandmother   . Stroke Paternal Grandmother   . Heart attack Paternal Grandmother   . Heart attack Paternal Grandfather   . Lung cancer Maternal Aunt     Social History   Socioeconomic History  . Marital status: Single    Spouse name: Not on file  . Number of children: Not on file  . Years of education: Not on file  . Highest education level: Not on file  Occupational History  . Not on file  Social Needs  . Financial resource strain: Not on file  . Food insecurity:    Worry: Not on file    Inability: Not on file  . Transportation needs:    Medical: Not on file    Non-medical: Not on file  Tobacco Use  . Smoking status: Never Smoker  . Smokeless tobacco: Never Used  Substance and Sexual Activity  . Alcohol use: Yes    Comment: 0-1  . Drug use: No  .  Sexual activity: Yes    Partners: Male    Birth control/protection: None  Lifestyle  . Physical activity:    Days per week: Not on file    Minutes per session: Not on file  . Stress: Not on file  Relationships  . Social connections:    Talks on phone: Not on file    Gets together: Not on file    Attends religious service: Not on file    Active member of club or organization: Not on file    Attends meetings of clubs or organizations: Not on file    Relationship status: Not on file  . Intimate partner violence:    Fear of current or ex partner: Not on file    Emotionally abused: Not on file    Physically abused: Not on file    Forced sexual activity: Not on file  Other Topics Concern  . Not on file  Social History Narrative  . Not on file    Review of Systems  Genitourinary: Positive for vaginal discharge.  All other systems reviewed and are negative.   PHYSICAL EXAMINATION:    BP 128/60   Pulse 98   Temp 98.3 F (36.8 C) (Oral)   Ht 5' 7.25" (1.708 m)   Wt 176 lb (79.8  kg)   LMP 03/05/2019 (Exact Date)   BMI 27.36 kg/m     General appearance: alert, cooperative and appears stated age   Pelvic: External genitalia:  no lesions              Urethra:  normal appearing urethra with no masses, tenderness or lesions              Bartholins and Skenes: normal                 Vagina: normal appearing vagina with normal color and discharge, no lesions              Cervix: no lesions                Bimanual Exam:  Uterus:  normal size, contour, position, consistency, mobility, non-tender              Adnexa: no mass, fullness, tenderness           Chaperone was present for exam.  ASSESSMENT  STD screening.  Vaginitis.  Amenorrhea.   PLAN  HIV, RPR, hep B and hep C testing.  Nuswab for vaginitis and STD screening. I discussed starting PNV.    An After Visit Summary was printed and given to the patient.  __15____ minutes face to face time of which over 50% was spent in counseling.

## 2019-04-07 LAB — HEP, RPR, HIV PANEL
HIV Screen 4th Generation wRfx: NONREACTIVE
Hepatitis B Surface Ag: NEGATIVE
RPR Ser Ql: NONREACTIVE

## 2019-04-07 LAB — HEPATITIS C ANTIBODY: Hep C Virus Ab: <0.1 s/co ratio (ref 0.0–0.9)

## 2019-04-10 LAB — NUSWAB VAGINITIS PLUS (VG+)
Candida albicans, NAA: NEGATIVE
Candida glabrata, NAA: NEGATIVE
Chlamydia trachomatis, NAA: NEGATIVE
Megasphaera 1: HIGH Score — AB
Neisseria gonorrhoeae, NAA: NEGATIVE
Trich vag by NAA: NEGATIVE

## 2019-04-11 ENCOUNTER — Other Ambulatory Visit: Payer: Self-pay | Admitting: *Deleted

## 2019-04-11 MED ORDER — METRONIDAZOLE 500 MG PO TABS: 500.0000 mg | ORAL_TABLET | Freq: Two times a day (BID) | ORAL | 0 refills | Status: AC

## 2019-04-14 DIAGNOSIS — M5441 Lumbago with sciatica, right side: Secondary | ICD-10-CM | POA: Diagnosis not present

## 2019-04-14 DIAGNOSIS — G8929 Other chronic pain: Secondary | ICD-10-CM | POA: Diagnosis not present

## 2019-05-06 DIAGNOSIS — M549 Dorsalgia, unspecified: Secondary | ICD-10-CM | POA: Diagnosis not present

## 2019-05-06 DIAGNOSIS — M546 Pain in thoracic spine: Secondary | ICD-10-CM | POA: Diagnosis not present

## 2019-06-16 ENCOUNTER — Ambulatory Visit: Payer: 59 | Admitting: Certified Nurse Midwife
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
## 2020-03-05 ENCOUNTER — Encounter: Payer: Self-pay | Admitting: Certified Nurse Midwife

## 2020-03-07 ENCOUNTER — Encounter: Payer: Self-pay | Admitting: Certified Nurse Midwife

## 2020-03-20 ENCOUNTER — Telehealth: Payer: Self-pay

## 2020-03-20 NOTE — Telephone Encounter (Signed)
Patients last aex was 06-04-18. Pap showed ASCUS HPV HR neg. Patient has previous history of a LEEP & Cryo. 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 Patient aware to call us to schedule annual due to her previous abnormal history whenever she is at home. Patient wished well during this time.  Previous recall is an 08 recall from 05/2019. Should this recall stay in. Please advise. Routed to Dr Edward Jolly.

## 2020-03-21 NOTE — Telephone Encounter (Signed)
I would move the patient forward to pap recall for 2022.  Her last pap was done in 2019 - ASCUS, negative HR HPV.

## 2020-03-22 NOTE — Telephone Encounter (Signed)
Recall moved to 2022.
